# Patient Record
Sex: Female | Born: 1960 | Race: Black or African American | Hispanic: No | Marital: Married | Smoking: Never smoker
Health system: Southern US, Community
[De-identification: ages and names within clinical notes are randomized; demographics above are authoritative.]

## PROBLEM LIST (undated history)

## (undated) DIAGNOSIS — I10 Essential (primary) hypertension: Secondary | ICD-10-CM

---

## 2018-04-30 ENCOUNTER — Ambulatory Visit (HOSPITAL_COMMUNITY)
Admission: RE | Admit: 2018-04-30 | Discharge: 2018-04-30 | Disposition: A | Discharge status: Home / Self Care | Payer: BLUE CROSS/BLUE SHIELD | Source: Ambulatory Visit | Attending: Urology | Admitting: Urology

## 2018-04-30 ENCOUNTER — Other Ambulatory Visit: Payer: Self-pay | Admitting: Urology

## 2018-04-30 DIAGNOSIS — D49512 Neoplasm of unspecified behavior of left kidney: Secondary | ICD-10-CM | POA: Diagnosis present

## 2018-05-03 ENCOUNTER — Other Ambulatory Visit: Payer: Self-pay | Admitting: Urology

## 2018-05-07 ENCOUNTER — Ambulatory Visit (HOSPITAL_COMMUNITY)
Admission: RE | Admit: 2018-05-07 | Discharge: 2018-05-07 | Disposition: A | Discharge status: Home / Self Care | Payer: BLUE CROSS/BLUE SHIELD | Source: Ambulatory Visit | Attending: Urology | Admitting: Urology

## 2018-05-07 DIAGNOSIS — D49512 Neoplasm of unspecified behavior of left kidney: Secondary | ICD-10-CM | POA: Diagnosis present

## 2018-05-07 MED ORDER — GADOBUTROL 1 MMOL/ML IV SOLN
10.0000 mL | Freq: Once | INTRAVENOUS | Status: AC | PRN
  Administered 2018-05-07: 10 mL via INTRAVENOUS

## 2018-05-08 LAB — POCT I-STAT CREATININE: CREATININE: 0.8 mg/dL (ref 0.44–1.00)

## 2018-05-17 ENCOUNTER — Ambulatory Visit (HOSPITAL_COMMUNITY): Payer: BLUE CROSS/BLUE SHIELD

## 2018-05-22 NOTE — Patient Instructions (Addendum)
Samantha Small  05/22/2018   Your procedure is scheduled on: 05-27-18  Report to [facility] Main  Entrance              Report to admitting at 930 AM    Call this number if you have problems the morning of surgery [phone]    Remember: Do not eat food  :After Midnight. Saturday NIGHT.             CLEAR LIQUIDS ALL DAY Sunday 05-26-18 AND FOLLOW DR Alinda Money BOWEL PREP INSTRUCTIONS             NO CLEAR LIQUIDS AFTER MIDNIGHT Sunday NIGHT.            BRUSH YOUR TEETH MORNING OF SURGERY AND RINSE YOUR MOUTH OUT, NO CHEWING GUM CANDY OR MINTS.     CLEAR LIQUID DIET   Foods Allowed                                                                     Foods Excluded  Coffee and tea, regular and decaf                             liquids that you cannot  Plain Jell-O in any flavor                                             see through such as: Fruit ices (not with fruit pulp)                                     milk, soups, orange juice  Iced Popsicles                                    All solid food Carbonated beverages, regular and diet                                    Cranberry, grape and apple juices Sports drinks like Gatorade Lightly seasoned clear broth or consume(fat free) Sugar, honey syrup  Sample Menu Breakfast                                Lunch                                     Supper Cranberry juice                    Beef broth                            Chicken broth Jell-O  Grape juice                           Apple juice Coffee or tea                        Jell-O                                      Popsicle                                                Coffee or tea                        Coffee or tea  _____________________________________________________________________     Take these medicines the morning of surgery with A SIP OF WATER: NONE                               You may not  have any metal on your body including hair pins and              piercings  Do not wear jewelry, make-up, lotions, powders or perfumes, deodorant             Do not wear nail polish.  Do not shave  48 hours prior to surgery.              Do not bring valuables to the hospital. Britt.  Contacts, dentures or bridgework may not be worn into surgery.  Leave suitcase in the car. After surgery it may be brought to your room.                   Please read over the following fact sheets you were given: _____________________________________________________________________   [facility] - Preparing for Surgery Before surgery, you can play an important role.  Because skin is not sterile, your skin needs to be as free of germs as possible.  You can reduce the number of germs on your skin by washing with CHG (chlorahexidine gluconate) soap before surgery.  CHG is an antiseptic cleaner which kills germs and bonds with the skin to continue killing germs even after washing. Please DO NOT use if you have an allergy to CHG or antibacterial soaps.  If your skin becomes reddened/irritated stop using the CHG and inform your nurse when you arrive at Short Stay. Do not shave (including legs and underarms) for at least 48 hours prior to the first CHG shower.  You may shave your face/neck. Please follow these instructions carefully:  1.  Shower with CHG Soap the night before surgery and the  morning of Surgery.  2.  If you choose to wash your hair, wash your hair first as usual with your  normal  shampoo.  3.  After you shampoo, rinse your hair and body thoroughly to remove the  shampoo.  4.  Use CHG as you would any other liquid soap.  You can apply chg directly  to the skin and wash                       Gently with a scrungie or clean washcloth.  5.  Apply the CHG Soap to your body ONLY FROM THE NECK DOWN.   Do not use on face/ open                            Wound or open sores. Avoid contact with eyes, ears mouth and genitals (private parts).                       Wash face,  Genitals (private parts) with your normal soap.             6.  Wash thoroughly, paying special attention to the area where your surgery  will be performed.  7.  Thoroughly rinse your body with warm water from the neck down.  8.  DO NOT shower/wash with your normal soap after using and rinsing off  the CHG Soap.                9.  Pat yourself dry with a clean towel.            10.  Wear clean pajamas.            11.  Place clean sheets on your bed the night of your first shower and do not  sleep with pets. Day of Surgery : Do not apply any lotions/deodorants the morning of surgery.  Please wear clean clothes to the hospital/surgery center.  FAILURE TO FOLLOW THESE INSTRUCTIONS MAY RESULT IN THE CANCELLATION OF YOUR SURGERY PATIENT SIGNATURE_________________________________  NURSE SIGNATURE__________________________________  ________________________________________________________________________  WHAT IS A BLOOD TRANSFUSION? Blood Transfusion Information  A transfusion is the replacement of blood or some of its parts. Blood is made up of multiple cells which provide different functions.  Red blood cells carry oxygen and are used for blood loss replacement.  White blood cells fight against infection.  Platelets control bleeding.  Plasma helps clot blood.  Other blood products are available for specialized needs, such as hemophilia or other clotting disorders. BEFORE THE TRANSFUSION  Who gives blood for transfusions?   Healthy volunteers who are fully evaluated to make sure their blood is safe. This is blood bank blood. Transfusion therapy is the safest it has ever been in the practice of medicine. Before blood is taken from a donor, a complete history is taken to make sure that person has no history of diseases nor engages in risky social  behavior (examples are intravenous drug use or sexual activity with multiple partners). The donor's travel history is screened to minimize risk of transmitting infections, such as malaria. The donated blood is tested for signs of infectious diseases, such as HIV and hepatitis. The blood is then tested to be sure it is compatible with you in order to minimize the chance of a transfusion reaction. If you or a relative donates blood, this is often done in anticipation of surgery and is not appropriate for emergency situations. It takes many days to process the donated blood. RISKS AND COMPLICATIONS Although transfusion therapy is very safe and saves many lives, the main dangers of transfusion include:   Getting an infectious disease.  Developing a transfusion reaction.  This is an allergic reaction to something in the blood you were given. Every precaution is taken to prevent this. The decision to have a blood transfusion has been considered carefully by your caregiver before blood is given. Blood is not given unless the benefits outweigh the risks. AFTER THE TRANSFUSION  Right after receiving a blood transfusion, you will usually feel much better and more energetic. This is especially true if your red blood cells have gotten low (anemic). The transfusion raises the level of the red blood cells which carry oxygen, and this usually causes an energy increase.  The nurse administering the transfusion will monitor you carefully for complications. HOME CARE INSTRUCTIONS  No special instructions are needed after a transfusion. You may find your energy is better. Speak with your caregiver about any limitations on activity for underlying diseases you may have. SEEK MEDICAL CARE IF:   Your condition is not improving after your transfusion.  You develop redness or irritation at the intravenous (IV) site. SEEK IMMEDIATE MEDICAL CARE IF:  Any of the following symptoms occur over the next 12 hours:  Shaking  chills.  You have a temperature by mouth above 102 F (38.9 C), not controlled by medicine.  Chest, back, or muscle pain.  People around you feel you are not acting correctly or are confused.  Shortness of breath or difficulty breathing.  Dizziness and fainting.  You get a rash or develop hives.  You have a decrease in urine output.  Your urine turns a dark color or changes to pink, red, or brown. Any of the following symptoms occur over the next 10 days:  You have a temperature by mouth above 102 F (38.9 C), not controlled by medicine.  Shortness of breath.  Weakness after normal activity.  The white part of the eye turns yellow (jaundice).  You have a decrease in the amount of urine or are urinating less often.  Your urine turns a dark color or changes to pink, red, or brown. Document Released: 05/26/2000 Document Revised: 08/21/2011 Document Reviewed: 01/13/2008 [facility] Patient Information 2014 [place], [place].  _______________________________________________________________________

## 2018-05-23 ENCOUNTER — Other Ambulatory Visit (HOSPITAL_COMMUNITY): Payer: BLUE CROSS/BLUE SHIELD

## 2018-05-24 ENCOUNTER — Encounter (HOSPITAL_COMMUNITY): Payer: Self-pay | Admitting: *Deleted

## 2018-05-24 ENCOUNTER — Other Ambulatory Visit: Payer: Self-pay

## 2018-05-24 ENCOUNTER — Encounter (HOSPITAL_COMMUNITY)
Admission: RE | Admit: 2018-05-24 | Discharge: 2018-05-24 | Disposition: A | Discharge status: Home / Self Care | Payer: BLUE CROSS/BLUE SHIELD | Source: Ambulatory Visit | Attending: Urology | Admitting: Urology

## 2018-05-24 DIAGNOSIS — Z01818 Encounter for other preprocedural examination: Secondary | ICD-10-CM | POA: Insufficient documentation

## 2018-05-24 HISTORY — DX: Essential (primary) hypertension: I10

## 2018-05-24 LAB — CBC
HEMATOCRIT: 38.8 % (ref 36.0–46.0)
Hemoglobin: 12 g/dL (ref 12.0–15.0)
MCH: 29.6 pg (ref 26.0–34.0)
MCHC: 30.9 g/dL (ref 30.0–36.0)
MCV: 95.6 fL (ref 80.0–100.0)
Platelets: 223 10*3/uL (ref 150–400)
RBC: 4.06 MIL/uL (ref 3.87–5.11)
RDW: 13.5 % (ref 11.5–15.5)
WBC: 8.7 10*3/uL (ref 4.0–10.5)
nRBC: 0 % (ref 0.0–0.2)

## 2018-05-24 LAB — BASIC METABOLIC PANEL
Anion gap: 8 (ref 5–15)
BUN: 11 mg/dL (ref 6–20)
CHLORIDE: 107 mmol/L (ref 98–111)
CO2: 29 mmol/L (ref 22–32)
CREATININE: 0.89 mg/dL (ref 0.44–1.00)
Calcium: 9.4 mg/dL (ref 8.9–10.3)
GFR calc Af Amer: >60 mL/min (ref >60)
GFR calc non Af Amer: >60 mL/min (ref >60)
Glucose, Bld: 108 mg/dL — ABNORMAL HIGH (ref 70–99)
Potassium: 3.5 mmol/L (ref 3.5–5.1)
SODIUM: 144 mmol/L (ref 135–145)

## 2018-05-24 LAB — ABO/RH: ABO/RH(D): A POS

## 2018-05-24 MED ORDER — MAGNESIUM CITRATE PO SOLN
1.0000 | Freq: Once | ORAL | Status: DC
  Filled 2018-05-24: qty 296

## 2018-05-24 NOTE — H&P (Signed)
Office Visit Report     04/30/2018   --------------------------------------------------------------------------------   Samantha Small  MRN: [id number]  PRIMARY CARE:  Chapman Fitch, MD  DOB: 01-Nov-1960, 57 year old Female  REFERRING:    SSN:   PROVIDER:  Raynelle Bring, M.D.    LOCATION:  [facility] [phone]   --------------------------------------------------------------------------------   CC/HPI: Left renal neoplasm   Samantha Small is a 57 year old female who recently presented to the emergency department at [facility] on 04/04/2018 with complaints of abdominal bloating and pain. As part of her evaluation, she underwent a CT scan of the abdomen and pelvis with contrast. This demonstrated a 3.3 x 2.5 cm, incidentally detected left renal mass that appeared hyperdense and concerning for a solid renal mass such as renal cell carcinoma. Her serum creatinine was 0.74. She has denied any hematuria or flank pain. To is no personal history of GU malignancy. There is no family history of GU malignancy.   Her past medical history is significant for gastroesophageal reflux disease, hyperlipidemia, and hypertension.   Her past surgical history is significant for 3 C-sections with Pfannenstiel incisions.     ALLERGIES: None   MEDICATIONS: Triamterene-Hydrochlorothiazid     GU PSH: None   NON-GU PSH: Cesarean Delivery    GU PMH: None   NON-GU PMH: GERD Hypercholesterolemia Hypertension    FAMILY HISTORY: stroke - Father    Notes: 2 sons, 2 daughters, mother living, father deceased    SOCIAL HISTORY: Marital Status: Married Ethnicity: Not Hispanic Or Latino; Race: Black or African American Current Smoking Status: Patient has never smoked.   Tobacco Use Assessment Completed: Used Tobacco in last 30 days? Does not drink anymore.  Drinks 2 caffeinated drinks per day.    REVIEW OF SYSTEMS:    GU Review Female:   Patient denies frequent urination, hard  to postpone urination, burning /pain with urination, get up at night to urinate, leakage of urine, stream starts and stops, trouble starting your stream, have to strain to urinate, and currently pregnant.  Gastrointestinal (Lower):   Patient denies diarrhea and constipation.  Gastrointestinal (Upper):   Patient denies nausea and vomiting.  Constitutional:   Patient denies fever, night sweats, weight loss, and fatigue.  Skin:   Patient denies skin rash/ lesion and itching.  Eyes:   Patient denies blurred vision and double vision.  Ears/ Nose/ Throat:   Patient denies sore throat and sinus problems.  Hematologic/Lymphatic:   Patient denies swollen glands and easy bruising.  Cardiovascular:   Patient denies leg swelling and chest pains.  Respiratory:   Patient denies cough and shortness of breath.  Endocrine:   Patient denies excessive thirst.  Musculoskeletal:   Patient denies back pain and joint pain.  Neurological:   Patient denies headaches and dizziness.  Psychologic:   Patient denies depression and anxiety.   VITAL SIGNS:      04/30/2018 11:22 AM  Weight 180 lb / 81.65 kg  Height 65 in / 165.1 cm  BP 121/82 mmHg  Pulse 73 /min  BMI 30.0 kg/m   MULTI-SYSTEM PHYSICAL EXAMINATION:    Constitutional: Well-nourished. No physical deformities. Normally developed. Good grooming.  Neck: Neck symmetrical, not swollen. Normal tracheal position.  Respiratory: No labored breathing, no use of accessory muscles. Clear bilaterally.  Cardiovascular: Normal temperature, normal extremity pulses, no swelling, no varicosities. Regular rate and rhythm.  Lymphatic: No enlargement of neck, axillae, groin.  Skin: No paleness, no jaundice, no cyanosis. No lesion, no  ulcer, no rash.  Neurologic / Psychiatric: Oriented to time, oriented to place, oriented to person. No depression, no anxiety, no agitation.  Gastrointestinal: No mass, no tenderness, no rigidity, non obese abdomen.  Eyes: Normal conjunctivae.  Normal eyelids.  Ears, Nose, Mouth, and Throat: Left ear no scars, no lesions, no masses. Right ear no scars, no lesions, no masses. Nose no scars, no lesions, no masses. Normal hearing. Normal lips.  Musculoskeletal: Normal gait and station of head and neck.     PAST DATA REVIEWED:  Source Of History:  Patient  Lab Test Review:   BUN/Creatinine  Urine Test Review:   Urinalysis  X-Ray Review: C.T. Abdomen/Pelvis: Reviewed Films.    Notes:                     I independently reviewed her CT scan from 04/04/2018. This demonstrates a 3.3 x 2.5 cm lesion of posterior aspect of the lower pole of the left kidney that appears to extend fairly centrally toward the collecting system. There is no regional lymphadenopathy, renal vein or IVC involvement, contralateral renal lesions that raise concern, or other evidence of metastatic disease.   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  X-Ray Notes: ...          Schedule X-Rays: 2 Weeks - MRI Abdomen With and Without I.V. Contrast  Return Visit/Planned Activity: Other See Visit Notes             Note: Will call with test results and to schedule surgery.          Document Letter(s):  Created for Patient: Clinical Summary         Notes:   1. Left renal neoplasm concerning for malignancy: I had a detailed discussion with Ms. Amedee and her husband today. The patient was provided information regarding their renal mass including the relative risk of benign versus malignant pathology and the natural history of renal cell carcinoma and other possible  malignancies of the kidney. The role of renal biopsy, laboratory testing, and imaging studies to further characterize renal masses and/or the presence of metastatic disease were explained. We discussed the role of active surveillance, surgical therapy with both radical nephrectomy and nephron-sparing surgery, and ablative therapy in the treatment of renal masses. In addition, we discussed our goals of providing an accurate diagnosis and oncologic control while maintaining optimal renal function as appropriate based on the size, location, and complexity of their renal mass as well as their co-morbidities.   We have discussed the risks of treatment in detail including but not limited to bleeding, infection, heart attack, stroke, death, venothromoboembolism, cancer recurrence, injury/damage to surrounding organs and structures, urine leak, the possibility of open surgical conversion for patients undergoing minimally invasive surgery, the risk of developing chronic kidney disease and its associated implications, and the potential risk of end stage renal disease possibly necessitating dialysis.   She will undergo completion of her  metastatic evaluation including chest imaging and laboratory studies today. She also has been scheduled for an MRI of her abdomen with and without contrast for definitive imaging evaluation. At her request, and considering the strong likelihood that this represents a probable enhancing mass consistent with renal cell carcinoma, she would like to be tentatively scheduled before the end of the year. Once her MRI has resulted, I will confirm with her the need to proceed with surgical treatment. She will be tentatively scheduled for a left robot assisted laparoscopic partial nephrectomy.   Cc: Dr. Chapman Fitch     * Signed by Raynelle Bring, M.D. on 04/30/18 at 3:11 PM (EST)*

## 2018-05-27 ENCOUNTER — Observation Stay (HOSPITAL_COMMUNITY)
Admission: RE | Admit: 2018-05-27 | Discharge: 2018-05-29 | Disposition: A | Discharge status: Home / Self Care | Payer: BLUE CROSS/BLUE SHIELD | Attending: Urology | Admitting: Urology

## 2018-05-27 ENCOUNTER — Other Ambulatory Visit: Payer: Self-pay

## 2018-05-27 ENCOUNTER — Ambulatory Visit (HOSPITAL_COMMUNITY): Payer: BLUE CROSS/BLUE SHIELD | Admitting: Anesthesiology

## 2018-05-27 ENCOUNTER — Encounter (HOSPITAL_COMMUNITY)
Admission: RE | Disposition: A | Discharge status: Home / Self Care | Payer: Self-pay | Source: Home / Self Care | Attending: Urology

## 2018-05-27 ENCOUNTER — Encounter (HOSPITAL_COMMUNITY): Payer: Self-pay

## 2018-05-27 DIAGNOSIS — E7800 Pure hypercholesterolemia, unspecified: Secondary | ICD-10-CM | POA: Diagnosis not present

## 2018-05-27 DIAGNOSIS — I10 Essential (primary) hypertension: Secondary | ICD-10-CM | POA: Diagnosis not present

## 2018-05-27 DIAGNOSIS — K219 Gastro-esophageal reflux disease without esophagitis: Secondary | ICD-10-CM | POA: Diagnosis not present

## 2018-05-27 DIAGNOSIS — N2889 Other specified disorders of kidney and ureter: Secondary | ICD-10-CM | POA: Insufficient documentation

## 2018-05-27 DIAGNOSIS — Z79899 Other long term (current) drug therapy: Secondary | ICD-10-CM | POA: Diagnosis not present

## 2018-05-27 DIAGNOSIS — C642 Malignant neoplasm of left kidney, except renal pelvis: Secondary | ICD-10-CM | POA: Diagnosis present

## 2018-05-27 DIAGNOSIS — D49512 Neoplasm of unspecified behavior of left kidney: Secondary | ICD-10-CM | POA: Diagnosis present

## 2018-05-27 HISTORY — PX: ROBOT ASSISTED LAPAROSCOPIC NEPHRECTOMY: SHX5140

## 2018-05-27 LAB — TYPE AND SCREEN
ABO/RH(D): A POS
Antibody Screen: NEGATIVE

## 2018-05-27 LAB — BASIC METABOLIC PANEL
Anion gap: 10 (ref 5–15)
BUN: 12 mg/dL (ref 6–20)
CO2: 26 mmol/L (ref 22–32)
Calcium: 8.9 mg/dL (ref 8.9–10.3)
Chloride: 103 mmol/L (ref 98–111)
Creatinine, Ser: 0.98 mg/dL (ref 0.44–1.00)
GFR calc Af Amer: >60 mL/min (ref >60)
GFR calc non Af Amer: >60 mL/min (ref >60)
Glucose, Bld: 145 mg/dL — ABNORMAL HIGH (ref 70–99)
POTASSIUM: 3.6 mmol/L (ref 3.5–5.1)
Sodium: 139 mmol/L (ref 135–145)

## 2018-05-27 LAB — HEMOGLOBIN AND HEMATOCRIT, BLOOD
HCT: 41.8 % (ref 36.0–46.0)
Hemoglobin: 13.3 g/dL (ref 12.0–15.0)

## 2018-05-27 SURGERY — NEPHRECTOMY, RADICAL, ROBOT-ASSISTED, LAPAROSCOPIC, ADULT
Anesthesia: General | Laterality: Left

## 2018-05-27 MED ORDER — OXYCODONE HCL 5 MG/5ML PO SOLN
5.0000 mg | Freq: Once | ORAL | Status: DC | PRN
  Filled 2018-05-27: qty 5

## 2018-05-27 MED ORDER — CEFAZOLIN SODIUM-DEXTROSE 2-4 GM/100ML-% IV SOLN
INTRAVENOUS | Status: AC
  Filled 2018-05-27: qty 100

## 2018-05-27 MED ORDER — ONDANSETRON HCL 4 MG/2ML IJ SOLN
4.0000 mg | INTRAMUSCULAR | Status: DC | PRN
  Administered 2018-05-27: 4 mg via INTRAVENOUS
  Filled 2018-05-27: qty 2

## 2018-05-27 MED ORDER — SUGAMMADEX SODIUM 200 MG/2ML IV SOLN
INTRAVENOUS | Status: DC | PRN
  Administered 2018-05-27: 200 mg via INTRAVENOUS

## 2018-05-27 MED ORDER — CEFAZOLIN SODIUM-DEXTROSE 1-4 GM/50ML-% IV SOLN
1.0000 g | Freq: Three times a day (TID) | INTRAVENOUS | Status: AC
  Administered 2018-05-27 – 2018-05-28 (×2): 1 g via INTRAVENOUS
  Filled 2018-05-27 (×2): qty 50

## 2018-05-27 MED ORDER — SODIUM CHLORIDE (PF) 0.9 % IJ SOLN
INTRAMUSCULAR | Status: DC | PRN
  Administered 2018-05-27: 30 mL

## 2018-05-27 MED ORDER — SODIUM CHLORIDE (PF) 0.9 % IJ SOLN
INTRAMUSCULAR | Status: AC
  Filled 2018-05-27: qty 50

## 2018-05-27 MED ORDER — HYDROMORPHONE HCL 2 MG/ML IJ SOLN
INTRAMUSCULAR | Status: AC
  Filled 2018-05-27: qty 1

## 2018-05-27 MED ORDER — HYDROMORPHONE HCL 1 MG/ML IJ SOLN
0.2500 mg | INTRAMUSCULAR | Status: DC | PRN
  Administered 2018-05-27 (×2): 0.5 mg via INTRAVENOUS

## 2018-05-27 MED ORDER — MORPHINE SULFATE (PF) 2 MG/ML IV SOLN
2.0000 mg | INTRAVENOUS | Status: DC | PRN
  Administered 2018-05-27: 2 mg via INTRAVENOUS
  Filled 2018-05-27: qty 1

## 2018-05-27 MED ORDER — PROPOFOL 10 MG/ML IV BOLUS
INTRAVENOUS | Status: AC
  Filled 2018-05-27: qty 20

## 2018-05-27 MED ORDER — HEMOSTATIC AGENTS (NO CHARGE) OPTIME
TOPICAL | Status: DC | PRN
  Administered 2018-05-27: 1 via TOPICAL

## 2018-05-27 MED ORDER — DIPHENHYDRAMINE HCL 12.5 MG/5ML PO ELIX: 12.5000 mg | ORAL_SOLUTION | Freq: Four times a day (QID) | ORAL | Status: DC | PRN

## 2018-05-27 MED ORDER — MIDAZOLAM HCL 2 MG/2ML IJ SOLN
INTRAMUSCULAR | Status: AC
  Filled 2018-05-27: qty 2

## 2018-05-27 MED ORDER — DEXTROSE-NACL 5-0.45 % IV SOLN
INTRAVENOUS | Status: DC
  Administered 2018-05-27 – 2018-05-28 (×3): via INTRAVENOUS

## 2018-05-27 MED ORDER — ACETAMINOPHEN 10 MG/ML IV SOLN
INTRAVENOUS | Status: AC
  Administered 2018-05-27: 1000 mg via INTRAVENOUS
  Filled 2018-05-27: qty 100

## 2018-05-27 MED ORDER — SODIUM CHLORIDE (PF) 0.9 % IJ SOLN
INTRAMUSCULAR | Status: AC
  Filled 2018-05-27: qty 10

## 2018-05-27 MED ORDER — DEXAMETHASONE SODIUM PHOSPHATE 10 MG/ML IJ SOLN
INTRAMUSCULAR | Status: AC
  Filled 2018-05-27: qty 1

## 2018-05-27 MED ORDER — PROMETHAZINE HCL 25 MG/ML IJ SOLN: 6.2500 mg | INTRAMUSCULAR | Status: DC | PRN

## 2018-05-27 MED ORDER — LACTATED RINGERS IR SOLN
Status: DC | PRN
  Administered 2018-05-27: 1

## 2018-05-27 MED ORDER — CEFAZOLIN SODIUM-DEXTROSE 2-4 GM/100ML-% IV SOLN
2.0000 g | Freq: Once | INTRAVENOUS | Status: AC
  Administered 2018-05-27 (×2): 2 g via INTRAVENOUS

## 2018-05-27 MED ORDER — ACETAMINOPHEN 10 MG/ML IV SOLN
1000.0000 mg | Freq: Four times a day (QID) | INTRAVENOUS | Status: DC
  Administered 2018-05-27 – 2018-05-28 (×3): 1000 mg via INTRAVENOUS
  Filled 2018-05-27 (×3): qty 100

## 2018-05-27 MED ORDER — MIDAZOLAM HCL 2 MG/2ML IJ SOLN
INTRAMUSCULAR | Status: DC | PRN
  Administered 2018-05-27: 2 mg via INTRAVENOUS

## 2018-05-27 MED ORDER — ROCURONIUM BROMIDE 10 MG/ML (PF) SYRINGE
PREFILLED_SYRINGE | INTRAVENOUS | Status: AC
  Filled 2018-05-27: qty 10

## 2018-05-27 MED ORDER — BUPIVACAINE LIPOSOME 1.3 % IJ SUSP
20.0000 mL | Freq: Once | INTRAMUSCULAR | Status: AC
  Administered 2018-05-27: 20 mL
  Filled 2018-05-27: qty 20

## 2018-05-27 MED ORDER — HYDROMORPHONE HCL 1 MG/ML IJ SOLN
INTRAMUSCULAR | Status: AC
  Administered 2018-05-27: 0.5 mg via INTRAVENOUS
  Filled 2018-05-27: qty 1

## 2018-05-27 MED ORDER — PROPOFOL 10 MG/ML IV BOLUS
INTRAVENOUS | Status: DC | PRN
  Administered 2018-05-27: 150 mg via INTRAVENOUS

## 2018-05-27 MED ORDER — LACTATED RINGERS IV SOLN
INTRAVENOUS | Status: DC
  Administered 2018-05-27 (×3): via INTRAVENOUS

## 2018-05-27 MED ORDER — SUFENTANIL CITRATE 50 MCG/ML IV SOLN
INTRAVENOUS | Status: DC | PRN
  Administered 2018-05-27 (×3): 10 ug via INTRAVENOUS
  Administered 2018-05-27: 20 ug via INTRAVENOUS

## 2018-05-27 MED ORDER — HYDROMORPHONE HCL 1 MG/ML IJ SOLN
INTRAMUSCULAR | Status: DC | PRN
  Administered 2018-05-27 (×2): 0.5 mg via INTRAVENOUS

## 2018-05-27 MED ORDER — STERILE WATER FOR IRRIGATION IR SOLN
Status: DC | PRN
  Administered 2018-05-27: 1000 mL

## 2018-05-27 MED ORDER — ONDANSETRON HCL 4 MG/2ML IJ SOLN
INTRAMUSCULAR | Status: DC | PRN
  Administered 2018-05-27: 4 mg via INTRAVENOUS

## 2018-05-27 MED ORDER — DEXAMETHASONE SODIUM PHOSPHATE 10 MG/ML IJ SOLN
INTRAMUSCULAR | Status: DC | PRN
  Administered 2018-05-27: 10 mg via INTRAVENOUS

## 2018-05-27 MED ORDER — DOCUSATE SODIUM 100 MG PO CAPS
100.0000 mg | ORAL_CAPSULE | Freq: Two times a day (BID) | ORAL | Status: DC
  Administered 2018-05-27 – 2018-05-29 (×4): 100 mg via ORAL
  Filled 2018-05-27 (×4): qty 1

## 2018-05-27 MED ORDER — TRAMADOL HCL 50 MG PO TABS: 50.0000 mg | ORAL_TABLET | Freq: Four times a day (QID) | ORAL | 0 refills | Status: AC | PRN

## 2018-05-27 MED ORDER — LIDOCAINE 2% (20 MG/ML) 5 ML SYRINGE
INTRAMUSCULAR | Status: AC
  Filled 2018-05-27: qty 5

## 2018-05-27 MED ORDER — SUFENTANIL CITRATE 50 MCG/ML IV SOLN
INTRAVENOUS | Status: AC
  Filled 2018-05-27: qty 1

## 2018-05-27 MED ORDER — DIPHENHYDRAMINE HCL 50 MG/ML IJ SOLN: 12.5000 mg | Freq: Four times a day (QID) | INTRAMUSCULAR | Status: DC | PRN

## 2018-05-27 MED ORDER — ROCURONIUM BROMIDE 10 MG/ML (PF) SYRINGE
PREFILLED_SYRINGE | INTRAVENOUS | Status: DC | PRN
  Administered 2018-05-27: 20 mg via INTRAVENOUS
  Administered 2018-05-27: 10 mg via INTRAVENOUS
  Administered 2018-05-27: 60 mg via INTRAVENOUS
  Administered 2018-05-27: 20 mg via INTRAVENOUS

## 2018-05-27 MED ORDER — OXYCODONE HCL 5 MG PO TABS: 5.0000 mg | ORAL_TABLET | Freq: Once | ORAL | Status: DC | PRN

## 2018-05-27 MED ORDER — ONDANSETRON HCL 4 MG/2ML IJ SOLN
INTRAMUSCULAR | Status: AC
  Filled 2018-05-27: qty 2

## 2018-05-27 MED ORDER — LIDOCAINE 2% (20 MG/ML) 5 ML SYRINGE
INTRAMUSCULAR | Status: DC | PRN
  Administered 2018-05-27: 100 mg via INTRAVENOUS

## 2018-05-27 MED ORDER — SUGAMMADEX SODIUM 200 MG/2ML IV SOLN
INTRAVENOUS | Status: AC
  Filled 2018-05-27: qty 2

## 2018-05-27 SURGICAL SUPPLY — 55 items
APPLICATOR SURGIFLO ENDO (HEMOSTASIS) ×6 IMPLANT
CHLORAPREP W/TINT 26ML (MISCELLANEOUS) ×3 IMPLANT
CLIP VESOLOCK LG 6/CT PURPLE (CLIP) ×3 IMPLANT
CLIP VESOLOCK MED LG 6/CT (CLIP) ×6 IMPLANT
COVER SURGICAL LIGHT HANDLE (MISCELLANEOUS) ×3 IMPLANT
COVER TIP SHEARS 8 DVNC (MISCELLANEOUS) ×1 IMPLANT
COVER TIP SHEARS 8MM DA VINCI (MISCELLANEOUS) ×2
COVER WAND RF STERILE (DRAPES) ×3 IMPLANT
DECANTER SPIKE VIAL GLASS SM (MISCELLANEOUS) ×3 IMPLANT
DERMABOND ADVANCED (GAUZE/BANDAGES/DRESSINGS) ×2
DERMABOND ADVANCED .7 DNX12 (GAUZE/BANDAGES/DRESSINGS) ×1 IMPLANT
DRAIN CHANNEL 15F RND FF 3/16 (WOUND CARE) IMPLANT
DRAPE ARM DVNC X/XI (DISPOSABLE) ×4 IMPLANT
DRAPE COLUMN DVNC XI (DISPOSABLE) ×1 IMPLANT
DRAPE DA VINCI XI ARM (DISPOSABLE) ×8
DRAPE DA VINCI XI COLUMN (DISPOSABLE) ×2
DRAPE INCISE IOBAN 66X45 STRL (DRAPES) ×3 IMPLANT
DRAPE SHEET LG 3/4 BI-LAMINATE (DRAPES) ×3 IMPLANT
DRSG TEGADERM 4X4.75 (GAUZE/BANDAGES/DRESSINGS) ×3 IMPLANT
ELECT PENCIL ROCKER SW 15FT (MISCELLANEOUS) ×3 IMPLANT
ELECT REM PT RETURN 15FT ADLT (MISCELLANEOUS) ×3 IMPLANT
EVACUATOR SILICONE 100CC (DRAIN) ×3 IMPLANT
GAUZE SPONGE 2X2 8PLY STRL LF (GAUZE/BANDAGES/DRESSINGS) ×1 IMPLANT
GLOVE BIO SURGEON STRL SZ 6.5 (GLOVE) ×2 IMPLANT
GLOVE BIO SURGEONS STRL SZ 6.5 (GLOVE) ×1
GLOVE BIOGEL M STRL SZ7.5 (GLOVE) ×6 IMPLANT
GOWN STRL REUS W/TWL LRG LVL3 (GOWN DISPOSABLE) ×9 IMPLANT
IRRIG SUCT STRYKERFLOW 2 WTIP (MISCELLANEOUS) ×3
IRRIGATION SUCT STRKRFLW 2 WTP (MISCELLANEOUS) ×1 IMPLANT
KIT BASIN OR (CUSTOM PROCEDURE TRAY) ×3 IMPLANT
NS IRRIG 1000ML POUR BTL (IV SOLUTION) ×3 IMPLANT
POUCH SPECIMEN RETRIEVAL 10MM (ENDOMECHANICALS) ×3 IMPLANT
PROTECTOR NERVE ULNAR (MISCELLANEOUS) ×6 IMPLANT
SEAL CANN UNIV 5-8 DVNC XI (MISCELLANEOUS) ×4 IMPLANT
SEAL XI 5MM-8MM UNIVERSAL (MISCELLANEOUS) ×8
SOLUTION ELECTROLUBE (MISCELLANEOUS) ×3 IMPLANT
SPONGE GAUZE 2X2 STER 10/PKG (GAUZE/BANDAGES/DRESSINGS) ×2
SURGIFLO W/THROMBIN 8M KIT (HEMOSTASIS) ×3 IMPLANT
SUT ETHILON 3 0 PS 1 (SUTURE) IMPLANT
SUT MNCRL AB 4-0 PS2 18 (SUTURE) ×6 IMPLANT
SUT PDS AB 0 CTX 36 PDP370T (SUTURE) IMPLANT
SUT V-LOC BARB 180 2/0GR6 GS22 (SUTURE) ×3
SUT VIC AB 0 CT1 27 (SUTURE) ×2
SUT VIC AB 0 CT1 27XBRD ANTBC (SUTURE) ×1 IMPLANT
SUT VICRYL 0 UR6 27IN ABS (SUTURE) ×3 IMPLANT
SUT VLOC BARB 180 ABS3/0GR12 (SUTURE) ×3
SUTURE V-LC BRB 180 2/0GR6GS22 (SUTURE) ×1 IMPLANT
SUTURE VLOC BRB 180 ABS3/0GR12 (SUTURE) ×1 IMPLANT
TOWEL OR 17X26 10 PK STRL BLUE (TOWEL DISPOSABLE) ×3 IMPLANT
TOWEL OR NON WOVEN STRL DISP B (DISPOSABLE) ×3 IMPLANT
TRAY FOLEY MTR SLVR 16FR STAT (SET/KITS/TRAYS/PACK) ×3 IMPLANT
TRAY LAPAROSCOPIC (CUSTOM PROCEDURE TRAY) ×3 IMPLANT
TROCAR UNIVERSAL OPT 12M 100M (ENDOMECHANICALS) IMPLANT
TROCAR XCEL 12X100 BLDLESS (ENDOMECHANICALS) ×3 IMPLANT
WATER STERILE IRR 1000ML POUR (IV SOLUTION) ×6 IMPLANT

## 2018-05-27 NOTE — Anesthesia Procedure Notes (Signed)
Procedure Name: Intubation Date/Time: 05/27/2018 10:49 AM Performed by: Sharlette Dense, CRNA Patient Re-evaluated:Patient Re-evaluated prior to induction Oxygen Delivery Method: Circle system utilized Preoxygenation: Pre-oxygenation with 100% oxygen Induction Type: IV induction Ventilation: Mask ventilation without difficulty and Oral airway inserted - appropriate to patient size Laryngoscope Size: Sabra Heck and 2 Grade View: Grade I Tube type: Oral Tube size: 7.5 mm Number of attempts: 1 Airway Equipment and Method: Stylet Placement Confirmation: ETT inserted through vocal cords under direct vision,  positive ETCO2 and breath sounds checked- equal and bilateral Secured at: 21 cm Tube secured with: Tape Dental Injury: Teeth and Oropharynx as per pre-operative assessment

## 2018-05-27 NOTE — Anesthesia Postprocedure Evaluation (Signed)
Anesthesia Post Note  Patient: Samantha Small  Procedure(s) Performed: XI ROBOTIC ASSISTED LAPAROSCOPIC PARTIAL NEPHRECTOMY (Left )     Patient location during evaluation: PACU Anesthesia Type: General Level of consciousness: awake and alert Pain management: pain level controlled Vital Signs Assessment: post-procedure vital signs reviewed and stable Respiratory status: spontaneous breathing, nonlabored ventilation, respiratory function stable and patient connected to nasal cannula oxygen Cardiovascular status: blood pressure returned to baseline and stable Postop Assessment: no apparent nausea or vomiting Anesthetic complications: no    Last Vitals:  Vitals:   05/27/18 1530 05/27/18 1545  BP: (!) 156/97 (!) 146/87  Pulse: 85 81  Resp: 11 12  Temp:    SpO2: 98% 99%    Last Pain:  Vitals:   05/27/18 1545  TempSrc:   PainSc: 0-No pain                 [name] S

## 2018-05-27 NOTE — Anesthesia Preprocedure Evaluation (Addendum)
Anesthesia Evaluation  Patient identified by MRN, date of birth, ID band Patient awake    Reviewed: Allergy & Precautions, NPO status , Patient's Chart, lab work & pertinent test results  Airway Mallampati: II  TM Distance: >3 FB Neck ROM: Full    Dental no notable dental hx.    Pulmonary neg pulmonary ROS,    Pulmonary exam normal breath sounds clear to auscultation       Cardiovascular hypertension, Normal cardiovascular exam Rhythm:Regular Rate:Normal     Neuro/Psych negative neurological ROS  negative psych ROS   GI/Hepatic negative GI ROS, Neg liver ROS,   Endo/Other  negative endocrine ROS  Renal/GU negative Renal ROS  negative genitourinary   Musculoskeletal negative musculoskeletal ROS (+)   Abdominal   Peds negative pediatric ROS (+)  Hematology negative hematology ROS (+)   Anesthesia Other Findings   Reproductive/Obstetrics negative OB ROS                             Anesthesia Physical Anesthesia Plan  ASA: II  Anesthesia Plan: General   Post-op Pain Management:    Induction: Intravenous  PONV Risk Score and Plan: 3 and Ondansetron, Dexamethasone and Treatment may vary due to age or medical condition  Airway Management Planned: Oral ETT  Additional Equipment:   Intra-op Plan:   Post-operative Plan: Extubation in OR  Informed Consent: I have reviewed the patients History and Physical, chart, labs and discussed the procedure including the risks, benefits and alternatives for the proposed anesthesia with the patient or authorized representative who has indicated his/her understanding and acceptance.     Dental advisory given  Plan Discussed with: CRNA and Surgeon  Anesthesia Plan Comments:         Anesthesia Quick Evaluation  

## 2018-05-27 NOTE — Progress Notes (Signed)
Patient ID: Samantha Small, female   DOB: 22-Jun-1960, 57 y.o.   MRN: [id number]  Post-op note  Subjective: The patient is doing well.  No complaints.  Objective: Vital signs in last 24 hours: Temp:  [97.5 F (36.4 C)-97.9 F (36.6 C)] 97.5 F (36.4 C) (12/16 1724) Pulse Rate:  [72-90] 77 (12/16 1724) Resp:  [10-18] 16 (12/16 1724) BP: (129-156)/(79-97) 141/87 (12/16 1724) SpO2:  [96 %-100 %] 100 % (12/16 1724) Weight:  [84.4 kg] 84.4 kg (12/16 1019)  Intake/Output from previous day: No intake/output data recorded. Intake/Output this shift: Total I/O In: 2750 [I.V.:2450; IV Piggyback:300] Out: 425 [Urine:150; Drains:75; Blood:200]  Physical Exam:  General: Alert and oriented. Abdomen: Soft, Nondistended. Incisions: Clean and dry.  Lab Results: Recent Labs    05/27/18 1537  HGB 13.3  HCT 41.8    Assessment/Plan: POD#0   1) Continue to monitor   Pryor Curia. MD   LOS: 0 days   Donnell Beauchamp,LES 05/27/2018, 6:16 PM

## 2018-05-27 NOTE — Discharge Instructions (Signed)

## 2018-05-27 NOTE — Interval H&P Note (Signed)
History and Physical Interval Note:  05/27/2018 9:40 AM  Samantha Small  has presented today for surgery, with the diagnosis of LEFT RENAL NEOPLASM  The various methods of treatment have been discussed with the patient and family. After consideration of risks, benefits and other options for treatment, the patient has consented to  Procedure(s): XI ROBOTIC ASSISTED LAPAROSCOPIC PARTIAL NEPHRECTOMY (Left) as a surgical intervention .  The patient's history has been reviewed, patient examined, no change in status, stable for surgery.  I have reviewed the patient's chart and labs.  Questions were answered to the patient's satisfaction.     Larine Fielding,LES

## 2018-05-27 NOTE — Transfer of Care (Signed)
Immediate Anesthesia Transfer of Care Note  Patient: Samantha Small  Procedure(s) Performed: XI ROBOTIC ASSISTED LAPAROSCOPIC PARTIAL NEPHRECTOMY (Left )  Patient Location: PACU  Anesthesia Type:General  Level of Consciousness: drowsy  Airway & Oxygen Therapy: Patient Spontanous Breathing and Patient connected to face mask oxygen  Post-op Assessment: Report given to RN and Post -op Vital signs reviewed and stable  Post vital signs: Reviewed and stable  Last Vitals:  Vitals Value Taken Time  BP 155/88 05/27/2018  3:22 PM  Temp    Pulse 90 05/27/2018  3:23 PM  Resp 10 05/27/2018  3:23 PM  SpO2 100 % 05/27/2018  3:23 PM  Vitals shown include unvalidated device data.  Last Pain:  Vitals:   05/27/18 1019  TempSrc:   PainSc: 0-No pain         Complications: No apparent anesthesia complications

## 2018-05-27 NOTE — Op Note (Signed)
Preoperative diagnosis: Left renal neoplasm  Postoperative diagnosis: Left renal neoplasm  Procedure:  1. Left robotic-assisted laparoscopic partial nephrectomy  Surgeon: Pryor Curia. M.D.  Assistant(s): Debbrah Alar, PA-C  An assistant was required for this surgical procedure.  The duties of the assistant included but were not limited to suctioning, passing suture, camera manipulation, retraction. This procedure would not be able to be performed without an Environmental consultant.  Anesthesia: General  Complications: None  EBL: 200 mL  IVF:  2000 mL crystalloid  Specimens: 1. Left renal neoplasm  Disposition of specimens: Pathology  Intraoperative findings:       1. Warm renal ischemia time: 18 minutes  Drains: 1. # 15 Blake perinephric drain  Indication:  Samantha Small is a 57 y.o. year old patient with a left renal neoplasm.  After a thorough review of the management options for their renal mass, they elected to proceed with surgical treatment and the above procedure.  We have discussed the potential benefits and risks of the procedure, side effects of the proposed treatment, the likelihood of the patient achieving the goals of the procedure, and any potential problems that might occur during the procedure or recuperation. Informed consent has been obtained.   Description of procedure:  The patient was taken to the operating room and a general anesthetic was administered. The patient was given preoperative antibiotics, placed in the left modified flank position with care to pad all potential pressure points, and prepped and draped in the usual sterile fashion. Next a preoperative timeout was performed.  A site was selected in the upper midline for initial port placement. This was placed using a standard open Hassan technique which allowed entry into the peritoneal cavity under direct vision and without difficulty. A 12 mm port was placed and a pneumoperitoneum established.  The camera was then used to inspect the abdomen and there was no evidence of any intra-abdominal injuries or other abnormalities. The remaining abdominal ports were then placed. 8 mm robotic ports were placed in the left upper quadrant, left lower quadrant, and far left lateral abdominal wall. A 8 mm port was placed to the left of the midline just off the rectus muscle for the camera.. All ports were placed under direct vision without difficulty. The surgical cart was then docked.   Utilizing the cautery scissors, the white line of Toldt was incised allowing the colon to be mobilized medially and the plane between the mesocolon and the anterior layer of Gerota's fascia to be developed and the kidney to be exposed.  The ureter and gonadal vein were identified inferiorly and the ureter was lifted anteriorly off the psoas muscle.  Dissection proceeded superiorly along the gonadal vein until the renal vein was identified.  The renal hilum was then carefully isolated with a combination of blunt and sharp dissection allowing the renal arterial and venous structures to be separated and isolated in preparation for renal hilar vessel clamping. There was a single renal artery that was located in between a branching renal vein.  12.5 g of IV mannitol was then administered.   Attention turned to the kidney and the perinephric fat surrounding the renal mass was removed and the kidney was mobilized sufficiently for exposure and resection of the renal mass.   Once the renal mass was properly isolated, preparations were made for resection of the tumor.  Reconstructive sutures were placed into the abdomen for the renorrhaphy portion of the procedure.  The renal artery was then clamped with bulldog  clamps.  The tumor was then excised with cold scissor dissection along with an adequate visible gross margin of normal renal parenchyma. The tumor appeared to be excised without any gross violation of the tumor. The renal collecting  system was entered during removal of the tumor.  A running 3-0 V-lock suture was then brought through the capsule of the kidney and run along the base of the renal defect to provide hemostasis and close any entry into the renal collecting system if present. Weck clips were used to secure this suture outside the renal capsule at the proximal and distal ends. An additional hemostatic agent (Surgiflo) was then placed into the renal defect. A running 2-0 V lock suture was then used to close the renal capsule using a sliding clip technique which resulted in excellent compression of the renal defect.    The bulldog clamps were then removed from the renal hilar vessel(s) and an additional 12.5 g of IV mannitol was administered. Total warm renal ischemia time was 18 minutes. The renal tumor resection site was examined. Hemostasis appeared adequate.   The kidney was placed back into its normal anatomic position and covered with perinephric fat as needed.  A # 62 Blake drain was then brought through the lateral lower port site and positioned in the perinephric space.  It was secured to the skin with a nylon suture. The surgical cart was undocked.  The renal tumor specimen was removed intact within an endopouch retrieval bag via the upper midline port site.  All other laparoscopic/robotic ports had been removed under direct vision and the pneumoperitoneum let down with inspection of the operative field performed and hemostasis again confirmed. The upper midline incision was then closed at the fascial level with 0-vicryl suture. All incision sites were then injected with local anesthetic and reapproximated at the skin level with 4-0 monocryl subcuticular closures.  Liquiband was applied to the skin.  The patient tolerated the procedure well and without complications.  The patient was able to be extubated and transferred to the recovery unit in satisfactory condition.  Pryor Curia MD

## 2018-05-28 ENCOUNTER — Encounter (HOSPITAL_COMMUNITY): Payer: Self-pay | Admitting: Urology

## 2018-05-28 DIAGNOSIS — C642 Malignant neoplasm of left kidney, except renal pelvis: Secondary | ICD-10-CM | POA: Diagnosis not present

## 2018-05-28 LAB — BASIC METABOLIC PANEL
Anion gap: 9 (ref 5–15)
BUN: 9 mg/dL (ref 6–20)
CHLORIDE: 107 mmol/L (ref 98–111)
CO2: 23 mmol/L (ref 22–32)
Calcium: 8.6 mg/dL — ABNORMAL LOW (ref 8.9–10.3)
Creatinine, Ser: 1.01 mg/dL — ABNORMAL HIGH (ref 0.44–1.00)
GFR calc Af Amer: >60 mL/min (ref >60)
GFR calc non Af Amer: >60 mL/min (ref >60)
Glucose, Bld: 125 mg/dL — ABNORMAL HIGH (ref 70–99)
Potassium: 4 mmol/L (ref 3.5–5.1)
SODIUM: 139 mmol/L (ref 135–145)

## 2018-05-28 LAB — CREATININE, FLUID (PLEURAL, PERITONEAL, JP DRAINAGE): CREAT FL: 1 mg/dL

## 2018-05-28 LAB — HEMOGLOBIN AND HEMATOCRIT, BLOOD
HCT: 34.8 % — ABNORMAL LOW (ref 36.0–46.0)
HEMATOCRIT: 33.8 % — AB (ref 36.0–46.0)
Hemoglobin: 10.7 g/dL — ABNORMAL LOW (ref 12.0–15.0)
Hemoglobin: 11.1 g/dL — ABNORMAL LOW (ref 12.0–15.0)

## 2018-05-28 MED ORDER — TRIAMTERENE-HCTZ 37.5-25 MG PO TABS
1.0000 | ORAL_TABLET | Freq: Every day | ORAL | Status: DC
  Administered 2018-05-28 – 2018-05-29 (×2): 1 via ORAL
  Filled 2018-05-28 (×2): qty 1

## 2018-05-28 MED ORDER — TRAMADOL HCL 50 MG PO TABS
50.0000 mg | ORAL_TABLET | Freq: Four times a day (QID) | ORAL | Status: DC | PRN
  Administered 2018-05-28 – 2018-05-29 (×4): 50 mg via ORAL
  Filled 2018-05-28: qty 1
  Filled 2018-05-28: qty 2
  Filled 2018-05-28: qty 1
  Filled 2018-05-28: qty 2

## 2018-05-28 MED ORDER — BISACODYL 10 MG RE SUPP
10.0000 mg | Freq: Once | RECTAL | Status: AC
  Administered 2018-05-28: 10 mg via RECTAL
  Filled 2018-05-28: qty 1

## 2018-05-28 NOTE — Progress Notes (Signed)
Patient ID: Samantha Small, female   DOB: 27-Dec-1960, 57 y.o.   MRN: [id number]  Pt doing well today.  Tolerating diet, ambulating, and pain controlled.  Drain Cr 1.0.  Will remove drain tonight with plans for d/c in AM.  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

## 2018-05-28 NOTE — Progress Notes (Signed)
Urology Progress Note   1 Day Post-Op  Subjective: NAEON. Pain controlled. Emesis last night with clears.   Objective: Vital signs in last 24 hours: Temp:  [97.5 F (36.4 C)-97.9 F (36.6 C)] 97.6 F (36.4 C) (12/17 0148) Pulse Rate:  [57-90] 57 (12/17 0148) Resp:  [10-20] 20 (12/17 0148) BP: (129-156)/(79-97) 129/81 (12/17 0148) SpO2:  [96 %-100 %] 100 % (12/17 0148) Weight:  [84.4 kg] 84.4 kg (12/16 1019)  Intake/Output from previous day: 12/16 0701 - 12/17 0700 In: 3418.5 [I.V.:3082.6; IV Piggyback:335.9] Out: 2652 [Urine:1850; Emesis/NG output:400; Drains:202; Blood:200] Intake/Output this shift: Total I/O In: 668.5 [I.V.:632.6; IV Piggyback:35.9] Out: 2177 [Urine:1700; Emesis/NG output:400; Drains:77]  Physical Exam:  General: Alert and oriented CV: RRR Lungs: Clear Abdomen: Soft, appropriately tender. Incisions c/d/i w/dermabond. JP SS.  GU: Foley in place draining clear yellow urine Ext: NT, No erythema  Lab Results: Recent Labs    05/27/18 1537 05/28/18 0623  HGB 13.3 10.7*  HCT 41.8 33.8*   BMET Recent Labs    05/27/18 1537  NA 139  K 3.6  CL 103  CO2 26  GLUCOSE 145*  BUN 12  CREATININE 0.98  CALCIUM 8.9     Studies/Results: No results found.  Assessment/Plan:  57 y.o. female s/p left robot assisted partial nephrectomy.  Overall doing well post-op.   - ADAT, medlock fluids if tolerating clears for breakfast.  - DC foley, perform trial of void. - Send JP creatinine. - Repeat H/H at 2pm.  - Repeat labs in AM.  - Start bowel regimen.  - Likely discharge tomorrow.  - Ambulate, IS.  - SCDs, ambulation for DVT ppx.    LOS: 0 days   Dorothey Baseman 05/28/2018, 6:48 AM

## 2018-05-28 NOTE — Discharge Summary (Signed)
[facility] Discharge Summary  Admit date: 05/27/2018  Discharge date and time: 05/29/18   Discharge to: Home  Discharge Service: Urology  Discharge Attending Physician:  Raynelle Bring, MD  Discharge  Diagnoses: Left renal mass   Secondary Diagnosis: Active Problems:   Neoplasm of left kidney   OR Procedures: Procedure(s): XI ROBOTIC ASSISTED LAPAROSCOPIC PARTIAL NEPHRECTOMY 05/27/2018   Ancillary Procedures: None   Discharge Day Services: The patient was seen and examined by the Urology team both in the morning and immediately prior to discharge.  Vital signs and laboratory values were stable and within normal limits.  The physical exam was benign and unchanged and all surgical wounds were examined.  Discharge instructions were explained and all questions answered.  Subjective  No acute events overnight. Pain Controlled. No fever or chills.  Objective Patient Vitals for the past 8 hrs:  BP Temp Temp src Pulse Resp SpO2  05/29/18 0600 - - - - - 95 %  05/29/18 0558 115/66 99.6 F (37.6 C) Oral 91 16 93 %  05/29/18 0210 116/69 99.3 F (37.4 C) Oral 88 12 96 %   No intake/output data recorded.  General Appearance:        No acute distress Lungs:                       Normal work of breathing on room air Heart:                                Regular rate and rhythm Abdomen:                         Soft, non-tender, non-distended, JP site with gauze.  Extremities:                      Warm and well perfused   Hospital Course:  The patient underwent left robot-assisted partial nephrectomy on 05/27/2018.  The patient tolerated the procedure well, was extubated in the OR, and afterwards was taken to the PACU for routine post-surgical care. When stable the patient was transferred to the floor.   The patient did well postoperatively.  The patient's diet was slowly advanced and at the time of discharge was tolerating a regular diet.  The patient was discharged home 2 Days  Post-Op, at which point was tolerating a regular solid diet, was able to void spontaneously, have adequate pain control with P.O. pain medication, and could ambulate without difficulty. The patient will follow up with [place] for post op check.   Condition at Discharge: Improved  Discharge Medications:  Allergies as of 05/29/2018   No Known Allergies     Medication List    TAKE these medications   traMADol 50 MG tablet Commonly known as:  ULTRAM Take 1-2 tablets (50-100 mg total) by mouth every 6 (six) hours as needed for moderate pain or severe pain.   triamterene-hydrochlorothiazide 37.5-25 MG tablet Commonly known as:  MAXZIDE-25 Take 1 tablet by mouth daily.

## 2018-05-29 DIAGNOSIS — C642 Malignant neoplasm of left kidney, except renal pelvis: Secondary | ICD-10-CM | POA: Diagnosis not present

## 2018-05-29 LAB — BASIC METABOLIC PANEL
Anion gap: 9 (ref 5–15)
BUN: 10 mg/dL (ref 6–20)
CHLORIDE: 101 mmol/L (ref 98–111)
CO2: 28 mmol/L (ref 22–32)
Calcium: 8.9 mg/dL (ref 8.9–10.3)
Creatinine, Ser: 1.17 mg/dL — ABNORMAL HIGH (ref 0.44–1.00)
GFR calc Af Amer: >60 mL/min (ref >60)
GFR calc non Af Amer: 52 mL/min — ABNORMAL LOW (ref >60)
Glucose, Bld: 109 mg/dL — ABNORMAL HIGH (ref 70–99)
Potassium: 4 mmol/L (ref 3.5–5.1)
SODIUM: 138 mmol/L (ref 135–145)

## 2018-05-29 LAB — HEMOGLOBIN AND HEMATOCRIT, BLOOD
HCT: 36.4 % (ref 36.0–46.0)
Hemoglobin: 11.4 g/dL — ABNORMAL LOW (ref 12.0–15.0)

## 2018-05-29 NOTE — Progress Notes (Signed)
Patient ID: Samantha Small, female   DOB: July 24, 1960, 57 y.o.   MRN: [id number]  2 Days Post-Op Subjective: Pt doing well.  Tolerating diet.  Ambulating.  Pain controlled.  Objective: Vital signs in last 24 hours: Temp:  [97.6 F (36.4 C)-99.6 F (37.6 C)] 99.6 F (37.6 C) (12/18 0558) Pulse Rate:  [82-93] 91 (12/18 0558) Resp:  [12-20] 16 (12/18 0558) BP: (112-124)/(51-69) 115/66 (12/18 0558) SpO2:  [93 %-98 %] 95 % (12/18 0600)  Intake/Output from previous day: 12/17 0701 - 12/18 0700 In: 1698.6 [P.O.:720; I.V.:978.6] Out: 1060 [Urine:1000; Drains:60] Intake/Output this shift: No intake/output data recorded.  Physical Exam:  General: Alert and oriented CV: RRR Lungs: Clear Abdomen: Soft, ND, positive BS Incisions: C/D/I Ext: NT, No erythema  Lab Results: Recent Labs    05/28/18 0623 05/28/18 1405 05/29/18 0613  HGB 10.7* 11.1* 11.4*  HCT 33.8* 34.8* 36.4   BMET Recent Labs    05/28/18 0623 05/29/18 0613  NA 139 138  K 4.0 4.0  CL 107 101  CO2 23 28  GLUCOSE 125* 109*  BUN 9 10  CREATININE 1.01* 1.17*  CALCIUM 8.6* 8.9     Studies/Results: No results found.  Assessment/Plan: POD # 2 - D/C home   LOS: 0 days   Samantha Small,LES 05/29/2018, 7:03 AM

## 2019-01-07 IMAGING — MR MR ABDOMEN WO/W CM
9 of 18 series · 20 of 48 positions shown · IV contrast (gadavist)
Comparison: CT the abdomen and pelvis 04/04/2018.

CLINICAL DATA: 57-year-old female with history of renal cell
carcinoma diagnosed 04/04/2018.

EXAM:
MRI ABDOMEN WITHOUT AND WITH CONTRAST
TECHNIQUE: Multiplanar multisequence MR imaging of the abdomen was performed
both before and after the administration of intravenous contrast.
CONTRAST:  10 mL of Gadavist.

[Series 3: T2 fat-sat · axial · 5.0mm · 0.78mm/px · z∈[-32,+213]mm · 2 of 50 slices shown]
[im 1/50]
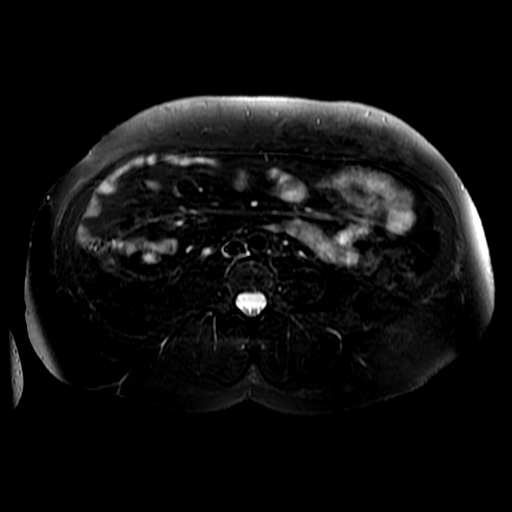
[im 50/50]
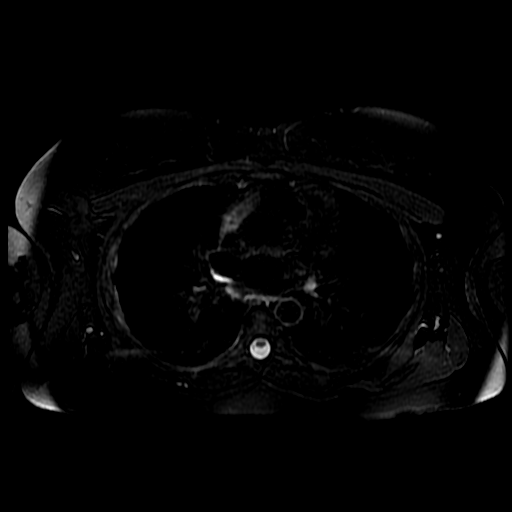

[Series 4: DWI b500 · axial · 6.0mm · 1.56mm/px · z∈[-39,+226]mm · 2 of 70 slices shown]
[im 1/70]
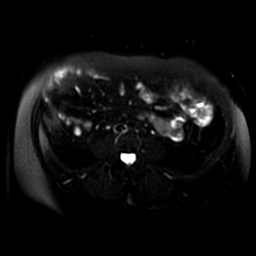
[im 70/70]
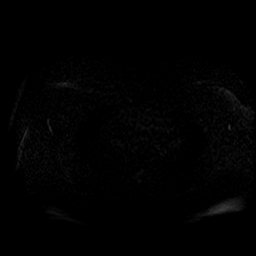

[Series 5: T2 · axial · 5.0mm · 0.78mm/px · z∈[-41,+229]mm · 2 of 55 slices shown (1 of 2)]
[im 1/55]
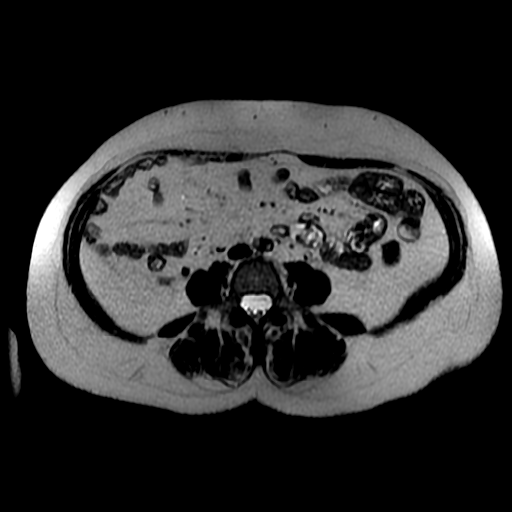
[im 55/55]
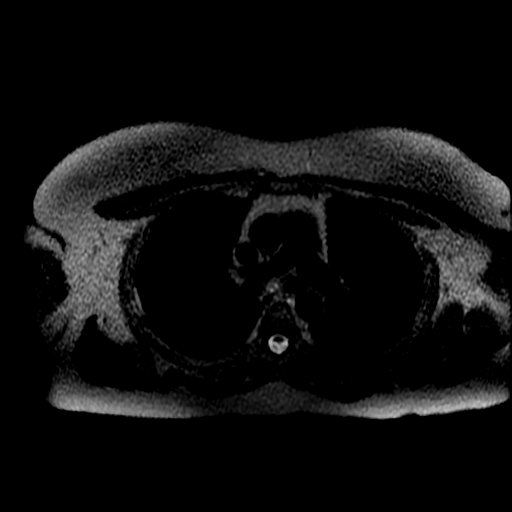

[Series 6: T2 · coronal · 5.0mm · 0.78mm/px · 2 of 50 slices shown (2 of 2)]
[im 1/50]
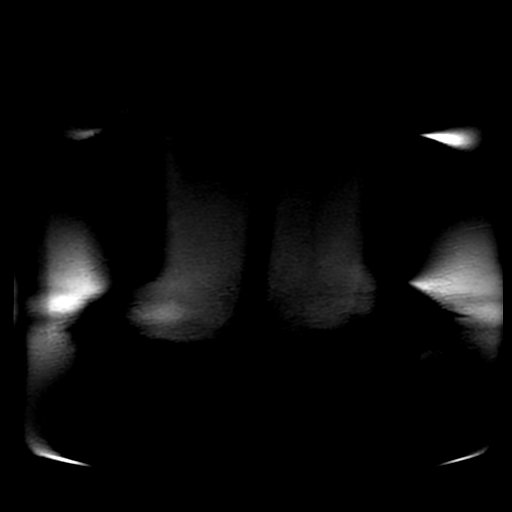
[im 50/50]
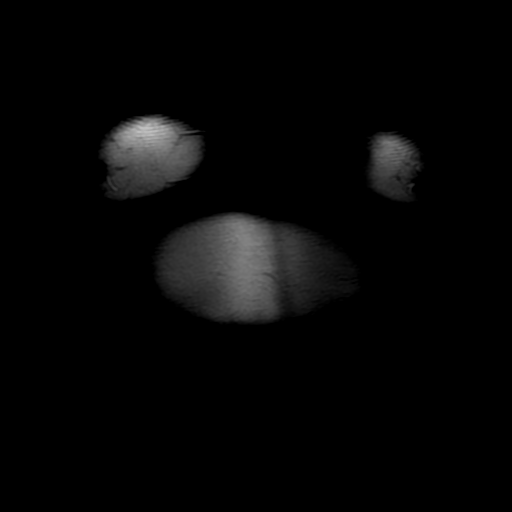

[Series 7: bSSFP · axial · 5.0mm · 0.78mm/px · z∈[-41,+229]mm · 2 of 55 slices shown]
[im 1/55]
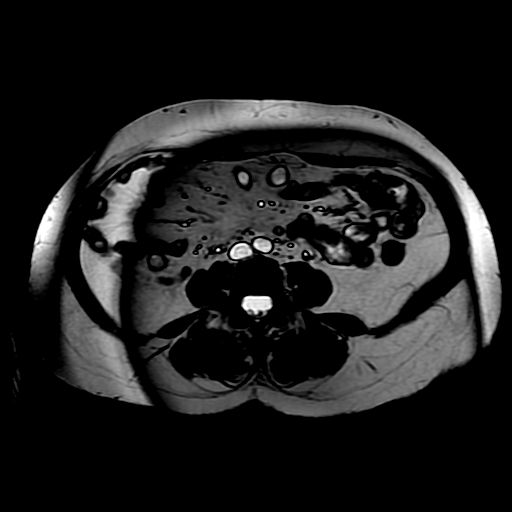
[im 55/55]
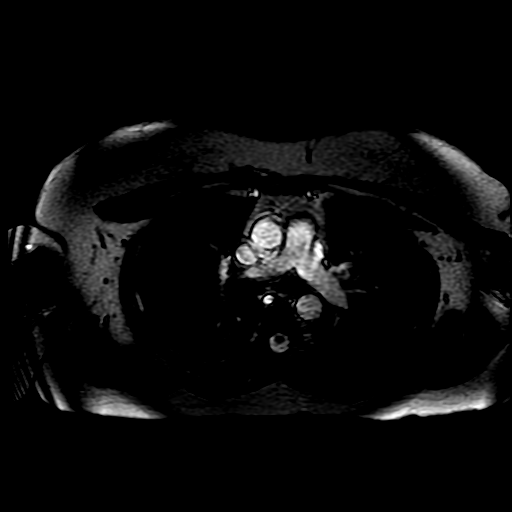

[Series 8: ax dualecho · axial · 5.0mm · 0.78mm/px · z∈[-41,+229]mm · 4 of 110 slices shown]
[im 1/110]
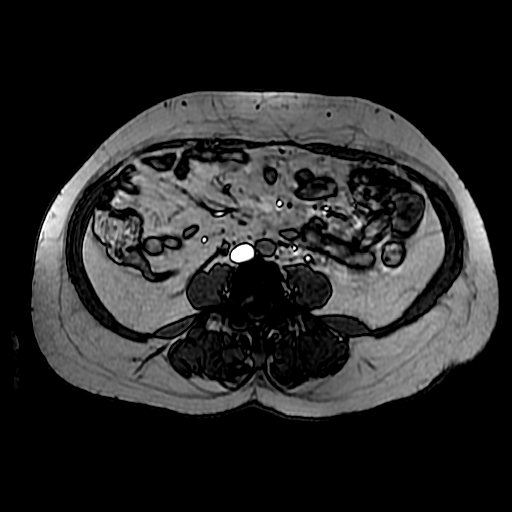
[im 37/110]
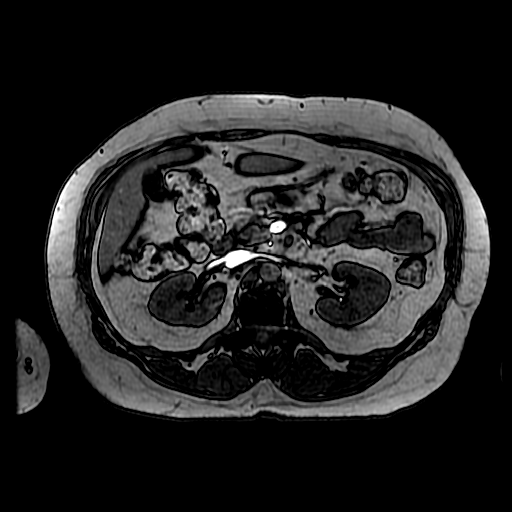
[im 73/110]
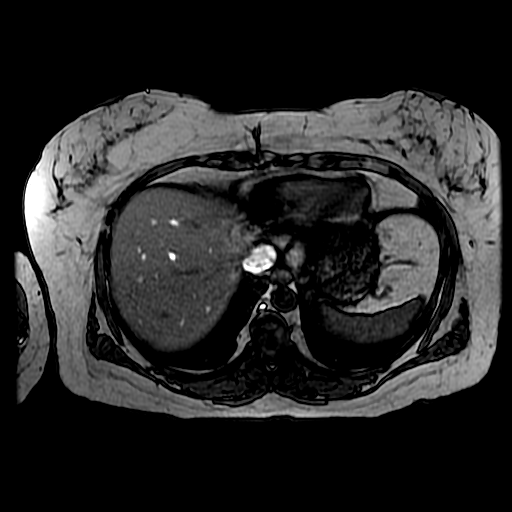
[im 110/110]
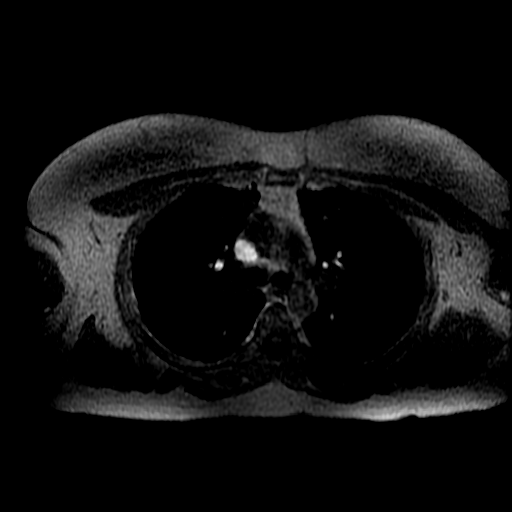

[Series 400: DWI · axial · 6.0mm · 1.56mm/px · 1 of 35 slices shown]
[im 1/35]
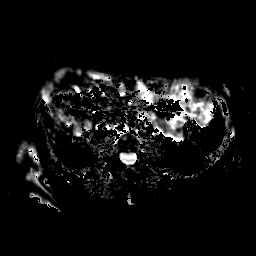

[Series 900: T1 dynamic · axial · 5.8mm · 0.78mm/px · z∈[-35,+217]mm · 3 of 88 slices shown (1 of 2)]
[im 1/88]
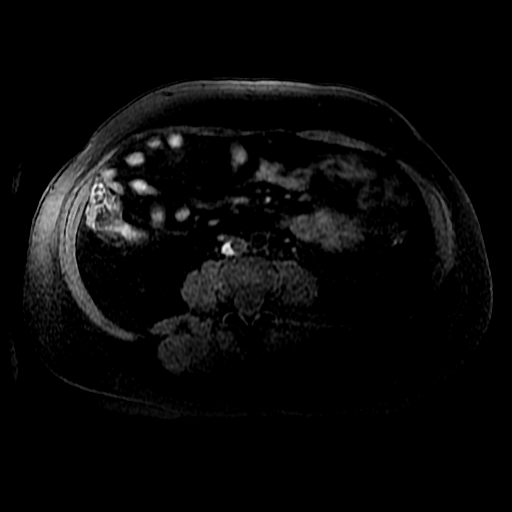
[im 44/88]
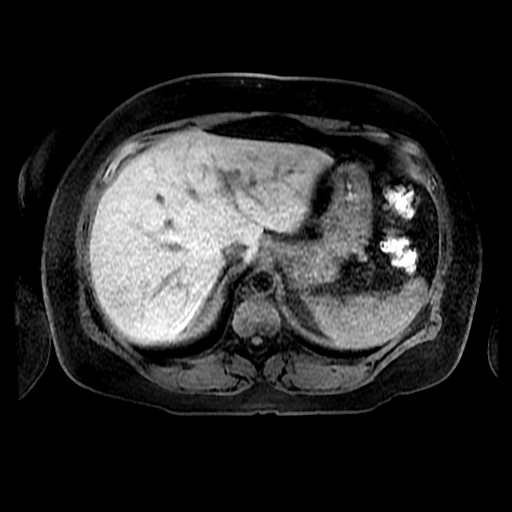
[im 88/88]
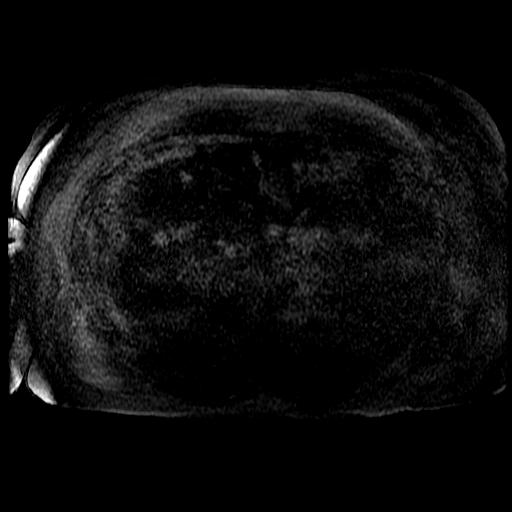

[Series 901: T1 dynamic · axial · 5.8mm · 0.78mm/px · z∈[-35,+90]mm · 2 of 88 slices shown (2 of 2)]
[im 1/88]
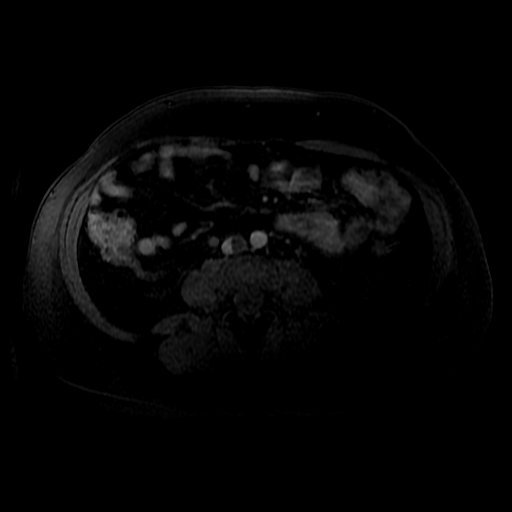
[im 44/88]
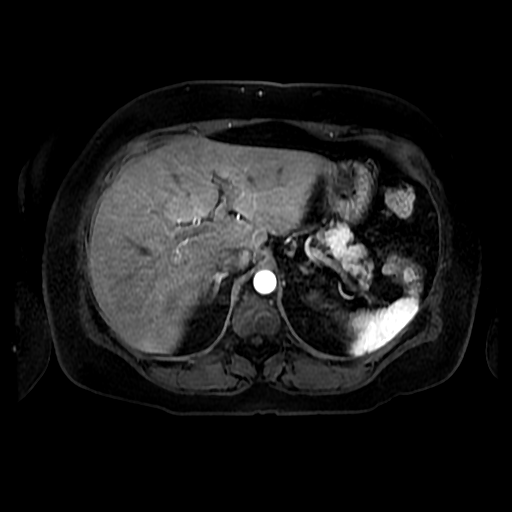

[20 of 48 positions shown; findings below may reference images not displayed]

FINDINGS: Lower chest: Visualized portions are unremarkable.

Hepatobiliary: No cystic or solid hepatic lesions. No intra or
extrahepatic biliary ductal dilatation. Gallbladder is normal in
appearance.

Pancreas: No pancreatic mass. No pancreatic ductal dilatation. No
pancreatic or peripancreatic fluid or inflammatory changes.

Spleen:  Unremarkable.

Adrenals/Urinary Tract: The lesion of concern is in the posterior
aspect of the upper pole of the right kidney (axial image 57 of
series 901 and coronal image 29 of series 10) measuring 3.1 x 2.6 x
3.0 cm. This lesion is heterogeneous in signal intensity on T1 and
T2 weighted images, demonstrates no diffusion restriction and
demonstrates avid arterial phase hyperenhancement with some washout
uncertain regions. This lesion is well separated from the left renal
vein which is widely patent. This lesion is completely encapsulated
within Gerota's fascia. Right kidney and bilateral adrenal glands
are normal in appearance. No hydroureteronephrosis in the visualized
portions of the abdomen.

Stomach/Bowel: Visualized portions are unremarkable.

Vascular/Lymphatic: No aneurysm identified in the visualized
abdominal vasculature. Left renal vein is widely patent. No
lymphadenopathy noted in the abdomen.

Other: No significant volume of ascites noted in the visualized
portions of the peritoneal cavity.

Musculoskeletal: No aggressive appearing osseous lesions are noted
in the visualized portions of the skeleton
IMPRESSION: 1. 3.1 x 2.6 x 3.0 cm enhancing lesion in the posterior aspect of
the upper pole of the left kidney, which is completely encapsulated
within Gerota's fascia, does not involve the left renal vein, and is
not associated with evidence of metastatic disease in the abdomen.

## 2019-01-24 ENCOUNTER — Other Ambulatory Visit: Payer: Self-pay

## 2019-01-24 ENCOUNTER — Ambulatory Visit (HOSPITAL_COMMUNITY)
Admission: RE | Admit: 2019-01-24 | Discharge: 2019-01-24 | Disposition: A | Discharge status: Home / Self Care | Payer: BC Managed Care – PPO | Source: Ambulatory Visit | Attending: Urology | Admitting: Urology

## 2019-01-24 ENCOUNTER — Other Ambulatory Visit (HOSPITAL_COMMUNITY): Payer: Self-pay | Admitting: Urology

## 2019-01-24 DIAGNOSIS — C642 Malignant neoplasm of left kidney, except renal pelvis: Secondary | ICD-10-CM | POA: Insufficient documentation

## 2019-05-28 ENCOUNTER — Other Ambulatory Visit: Payer: Self-pay | Admitting: Urology

## 2019-05-28 DIAGNOSIS — C642 Malignant neoplasm of left kidney, except renal pelvis: Secondary | ICD-10-CM

## 2019-08-01 ENCOUNTER — Ambulatory Visit (HOSPITAL_COMMUNITY)
Admission: RE | Admit: 2019-08-01 | Discharge: 2019-08-01 | Disposition: A | Discharge status: Home / Self Care | Payer: BC Managed Care – PPO | Source: Ambulatory Visit | Attending: Urology | Admitting: Urology

## 2019-08-01 ENCOUNTER — Other Ambulatory Visit: Payer: Self-pay

## 2019-08-01 DIAGNOSIS — C642 Malignant neoplasm of left kidney, except renal pelvis: Secondary | ICD-10-CM | POA: Insufficient documentation

## 2019-08-01 LAB — POCT I-STAT CREATININE: Creatinine, Ser: 1.2 mg/dL — ABNORMAL HIGH (ref 0.44–1.00)

## 2019-08-01 MED ORDER — GADOBUTROL 1 MMOL/ML IV SOLN
8.0000 mL | Freq: Once | INTRAVENOUS | Status: AC | PRN
  Administered 2019-08-01: 12:00:00 8 mL via INTRAVENOUS

## 2020-02-27 ENCOUNTER — Ambulatory Visit (HOSPITAL_COMMUNITY)
Admission: RE | Admit: 2020-02-27 | Discharge: 2020-02-27 | Disposition: A | Discharge status: Home / Self Care | Payer: BC Managed Care – PPO | Source: Ambulatory Visit | Attending: Urology | Admitting: Urology

## 2020-02-27 ENCOUNTER — Other Ambulatory Visit: Payer: Self-pay

## 2020-02-27 ENCOUNTER — Other Ambulatory Visit (HOSPITAL_COMMUNITY): Payer: Self-pay | Admitting: Urology

## 2020-02-27 DIAGNOSIS — C642 Malignant neoplasm of left kidney, except renal pelvis: Secondary | ICD-10-CM

## 2022-10-13 ENCOUNTER — Other Ambulatory Visit: Payer: Self-pay

## 2022-10-13 ENCOUNTER — Ambulatory Visit (HOSPITAL_COMMUNITY)
Admission: RE | Admit: 2022-10-13 | Discharge: 2022-10-13 | Disposition: A | Discharge status: Home / Self Care | Payer: BC Managed Care – PPO | Source: Ambulatory Visit | Attending: Urology | Admitting: Urology

## 2022-10-13 DIAGNOSIS — Z85528 Personal history of other malignant neoplasm of kidney: Secondary | ICD-10-CM | POA: Insufficient documentation
